# Patient Record
Sex: Female | Born: 1980 | Race: Black or African American | Hispanic: No | Marital: Single | Smoking: Never smoker
Health system: Southern US, Community
[De-identification: ages and names within clinical notes are randomized; demographics above are authoritative.]

## PROBLEM LIST (undated history)

## (undated) DIAGNOSIS — K589 Irritable bowel syndrome without diarrhea: Secondary | ICD-10-CM

## (undated) HISTORY — DX: Irritable bowel syndrome without diarrhea: K58.9

---

## 1998-08-18 ENCOUNTER — Other Ambulatory Visit: Admission: RE | Admit: 1998-08-18 | Discharge: 1998-08-18 | Payer: Self-pay | Admitting: Obstetrics & Gynecology

## 1999-03-30 ENCOUNTER — Inpatient Hospital Stay (HOSPITAL_COMMUNITY): Admission: AD | Admit: 1999-03-30 | Discharge: 1999-04-02 | Payer: Self-pay | Admitting: Obstetrics & Gynecology

## 1999-04-29 ENCOUNTER — Other Ambulatory Visit: Admission: RE | Admit: 1999-04-29 | Discharge: 1999-04-29 | Payer: Self-pay | Admitting: Obstetrics and Gynecology

## 1999-06-17 ENCOUNTER — Other Ambulatory Visit: Admission: RE | Admit: 1999-06-17 | Discharge: 1999-06-17 | Payer: Self-pay | Admitting: Obstetrics and Gynecology

## 1999-06-17 ENCOUNTER — Encounter (INDEPENDENT_AMBULATORY_CARE_PROVIDER_SITE_OTHER): Payer: Self-pay | Admitting: Specialist

## 1999-11-24 ENCOUNTER — Other Ambulatory Visit: Admission: RE | Admit: 1999-11-24 | Discharge: 1999-11-24 | Payer: Self-pay | Admitting: Obstetrics and Gynecology

## 2000-12-30 ENCOUNTER — Emergency Department (HOSPITAL_COMMUNITY): Admission: EM | Admit: 2000-12-30 | Discharge: 2000-12-30 | Payer: Self-pay | Admitting: *Deleted

## 2001-03-16 ENCOUNTER — Encounter: Payer: Self-pay | Admitting: *Deleted

## 2001-03-16 ENCOUNTER — Inpatient Hospital Stay (HOSPITAL_COMMUNITY): Admission: AD | Admit: 2001-03-16 | Discharge: 2001-03-16 | Payer: Self-pay | Admitting: *Deleted

## 2001-05-16 ENCOUNTER — Other Ambulatory Visit: Admission: RE | Admit: 2001-05-16 | Discharge: 2001-05-16 | Payer: Self-pay | Admitting: Family Medicine

## 2001-07-23 ENCOUNTER — Other Ambulatory Visit: Admission: RE | Admit: 2001-07-23 | Discharge: 2001-07-23 | Payer: Self-pay | Admitting: Obstetrics & Gynecology

## 2001-07-23 ENCOUNTER — Encounter: Admission: RE | Admit: 2001-07-23 | Discharge: 2001-07-23 | Payer: Self-pay | Admitting: Obstetrics & Gynecology

## 2002-03-06 ENCOUNTER — Other Ambulatory Visit: Admission: RE | Admit: 2002-03-06 | Discharge: 2002-03-06 | Payer: Self-pay | Admitting: Family Medicine

## 2004-02-04 ENCOUNTER — Other Ambulatory Visit: Admission: RE | Admit: 2004-02-04 | Discharge: 2004-02-04 | Payer: Self-pay | Admitting: Family Medicine

## 2004-08-11 ENCOUNTER — Other Ambulatory Visit: Admission: RE | Admit: 2004-08-11 | Discharge: 2004-08-11 | Payer: Self-pay | Admitting: Family Medicine

## 2006-07-19 ENCOUNTER — Other Ambulatory Visit: Admission: RE | Admit: 2006-07-19 | Discharge: 2006-07-19 | Payer: Self-pay | Admitting: Family Medicine

## 2007-03-24 ENCOUNTER — Emergency Department (HOSPITAL_COMMUNITY): Admission: EM | Admit: 2007-03-24 | Discharge: 2007-03-24 | Payer: Self-pay | Admitting: *Deleted

## 2007-04-19 ENCOUNTER — Other Ambulatory Visit: Admission: RE | Admit: 2007-04-19 | Discharge: 2007-04-19 | Payer: Self-pay | Admitting: Family Medicine

## 2007-12-05 ENCOUNTER — Other Ambulatory Visit: Admission: RE | Admit: 2007-12-05 | Discharge: 2007-12-05 | Payer: Self-pay | Admitting: Family Medicine

## 2008-06-19 ENCOUNTER — Other Ambulatory Visit: Admission: RE | Admit: 2008-06-19 | Discharge: 2008-06-19 | Payer: Self-pay | Admitting: Family Medicine

## 2009-10-02 ENCOUNTER — Emergency Department (HOSPITAL_COMMUNITY): Admission: EM | Admit: 2009-10-02 | Discharge: 2009-10-02 | Payer: Self-pay | Admitting: Emergency Medicine

## 2009-10-27 ENCOUNTER — Emergency Department (HOSPITAL_COMMUNITY): Admission: EM | Admit: 2009-10-27 | Discharge: 2009-10-27 | Payer: Self-pay | Admitting: Emergency Medicine

## 2010-01-24 ENCOUNTER — Emergency Department (HOSPITAL_COMMUNITY): Admission: EM | Admit: 2010-01-24 | Discharge: 2010-01-24 | Payer: Self-pay | Admitting: Family Medicine

## 2010-10-19 ENCOUNTER — Inpatient Hospital Stay (INDEPENDENT_AMBULATORY_CARE_PROVIDER_SITE_OTHER)
Admission: RE | Admit: 2010-10-19 | Discharge: 2010-10-19 | Disposition: A | Discharge status: Home / Self Care | Payer: 59 | Source: Ambulatory Visit | Attending: Emergency Medicine | Admitting: Emergency Medicine

## 2010-10-19 ENCOUNTER — Ambulatory Visit (INDEPENDENT_AMBULATORY_CARE_PROVIDER_SITE_OTHER): Payer: 59

## 2010-10-19 DIAGNOSIS — K5900 Constipation, unspecified: Secondary | ICD-10-CM

## 2010-10-19 LAB — POCT URINALYSIS DIPSTICK
Bilirubin Urine: NEGATIVE
Hgb urine dipstick: NEGATIVE
Ketones, ur: NEGATIVE mg/dL
Nitrite: NEGATIVE
Protein, ur: NEGATIVE mg/dL
Specific Gravity, Urine: 1.02 (ref 1.005–1.030)
Urine Glucose, Fasting: NEGATIVE mg/dL
Urobilinogen, UA: 1 mg/dL (ref 0.0–1.0)
pH: 6.5 (ref 5.0–8.0)

## 2010-10-19 LAB — POCT PREGNANCY, URINE: Preg Test, Ur: NEGATIVE

## 2010-11-16 ENCOUNTER — Inpatient Hospital Stay (INDEPENDENT_AMBULATORY_CARE_PROVIDER_SITE_OTHER)
Admission: RE | Admit: 2010-11-16 | Discharge: 2010-11-16 | Disposition: A | Discharge status: Home / Self Care | Payer: 59 | Source: Ambulatory Visit | Attending: Family Medicine | Admitting: Family Medicine

## 2010-11-16 DIAGNOSIS — R05 Cough: Secondary | ICD-10-CM

## 2010-11-16 DIAGNOSIS — J069 Acute upper respiratory infection, unspecified: Secondary | ICD-10-CM

## 2010-11-16 DIAGNOSIS — H6120 Impacted cerumen, unspecified ear: Secondary | ICD-10-CM

## 2010-11-28 LAB — POCT URINALYSIS DIP (DEVICE)
Bilirubin Urine: NEGATIVE
Glucose, UA: NEGATIVE mg/dL
Ketones, ur: NEGATIVE mg/dL
Nitrite: NEGATIVE
Protein, ur: 30 mg/dL — AB
Specific Gravity, Urine: 1.02 (ref 1.005–1.030)
Urobilinogen, UA: 2 mg/dL — ABNORMAL HIGH (ref 0.0–1.0)
pH: 7 (ref 5.0–8.0)

## 2010-11-28 LAB — POCT PREGNANCY, URINE: Preg Test, Ur: NEGATIVE

## 2010-12-01 LAB — WET PREP, GENITAL

## 2010-12-01 LAB — GC/CHLAMYDIA PROBE AMP, GENITAL: GC Probe Amp, Genital: NEGATIVE

## 2010-12-01 LAB — POCT PREGNANCY, URINE: Preg Test, Ur: NEGATIVE

## 2011-08-04 ENCOUNTER — Emergency Department (HOSPITAL_COMMUNITY)
Admission: EM | Admit: 2011-08-04 | Discharge: 2011-08-04 | Disposition: A | Discharge status: Home / Self Care | Payer: 59 | Source: Home / Self Care | Attending: Family Medicine | Admitting: Family Medicine

## 2011-08-04 ENCOUNTER — Encounter: Payer: Self-pay | Admitting: Cardiology

## 2011-08-04 DIAGNOSIS — S83419A Sprain of medial collateral ligament of unspecified knee, initial encounter: Secondary | ICD-10-CM

## 2011-08-04 MED ORDER — NAPROXEN 375 MG PO TABS: 375.0000 mg | ORAL_TABLET | Freq: Two times a day (BID) | ORAL | Status: AC

## 2011-08-04 MED ORDER — HYDROCODONE-ACETAMINOPHEN 5-325 MG PO TABS: ORAL_TABLET | ORAL | Status: AC

## 2011-08-04 NOTE — ED Provider Notes (Signed)
History     CSN: [id number] Arrival date & time: 08/04/2011 11:01 AM   First MD Initiated Contact with Patient 08/04/11 1018      Chief Complaint  Patient presents with  . Knee Pain    (Consider location/radiation/quality/duration/timing/severity/associated sxs/prior treatment) HPI Comments: Lynn Mitchell presents for evaluation of pain in her RIGHT knee over the last week. She denies any specific injury but reports that she had been working out on treadmills and elliptical machines lately. She denies any numbness, tingling, or weakness. She reports pain on the medial side of her RIGHT knee, mostly with movement.   Patient is a 30 y.o. female presenting with knee pain. The history is provided by the patient.  Knee Pain This is a new problem. The current episode started more than 2 days ago. The problem occurs constantly. The problem has not changed since onset.The symptoms are aggravated by walking, exertion and standing.    History reviewed. No pertinent past medical history.  Past Surgical History  Procedure Date  . Cesarean section 2000    No family history on file.  History  Substance Use Topics  . Smoking status: Never Smoker   . Smokeless tobacco: Not on file  . Alcohol Use: Yes     social    OB History    Grav Para Term Preterm Abortions TAB SAB Ect Mult Living                  Review of Systems  Constitutional: Negative.   HENT: Negative.   Eyes: Negative.   Respiratory: Negative.   Cardiovascular: Negative.   Gastrointestinal: Negative.   Genitourinary: Negative.   Musculoskeletal: Positive for myalgias and arthralgias.  Skin: Negative.   Neurological: Negative.     Allergies  Review of patient's allergies indicates no known allergies.  Home Medications   Current Outpatient Rx  Name Route Sig Dispense Refill  . CLINDAMYCIN PHOS-BENZOYL PEROX 1.2-5 % EX GEL Topical Apply topically daily.      Marland Kitchen DOXY-CAPS PO Oral Take 2 capsules by mouth 2 (two) times  daily.      Marland Kitchen PHENTERMINE HCL 15 MG PO CAPS Oral Take 15 mg by mouth every morning.      Marland Kitchen HYDROCODONE-ACETAMINOPHEN 5-325 MG PO TABS  Take one to two tablets every 4 to 6 hours as needed for pain 20 tablet 0  . NAPROXEN 375 MG PO TABS Oral Take 1 tablet (375 mg total) by mouth 2 (two) times daily. 20 tablet 0    BP 109/73  Pulse 68  Temp(Src) 97.6 F (36.4 C) (Oral)  Resp 18  SpO2 100%  LMP 07/30/2011  Physical Exam  Constitutional: She is oriented to person, place, and time. She appears well-developed and well-nourished.  HENT:  Head: Normocephalic and atraumatic.  Eyes: EOM are normal.  Neck: Normal range of motion.  Musculoskeletal:       Legs: Neurological: She is alert and oriented to person, place, and time.  Skin: Skin is warm and dry.    ED Course  Procedures (including critical care time)  Labs Reviewed - No data to display No results found.   1. Knee MCL sprain       MDM          Richardo Priest, MD 08/04/11 1149

## 2011-08-04 NOTE — ED Notes (Signed)
Pt reports knee pain that has been ongoing for the past week.  Pain started in the arch of her foot and that has calmed down. Now the pain is in the medial side of the right knee. Pain worse at night when not using it for a period of time. Pt describes pain a pressure and stiffness.

## 2012-10-26 IMAGING — CR DG ABDOMEN 1V
1 series · 1 of 1 positions shown · non-contrast
Comparison: None.

CLINICAL DATA: Abdominal pain.

ABDOMEN - 1 VIEW

[view not recorded]
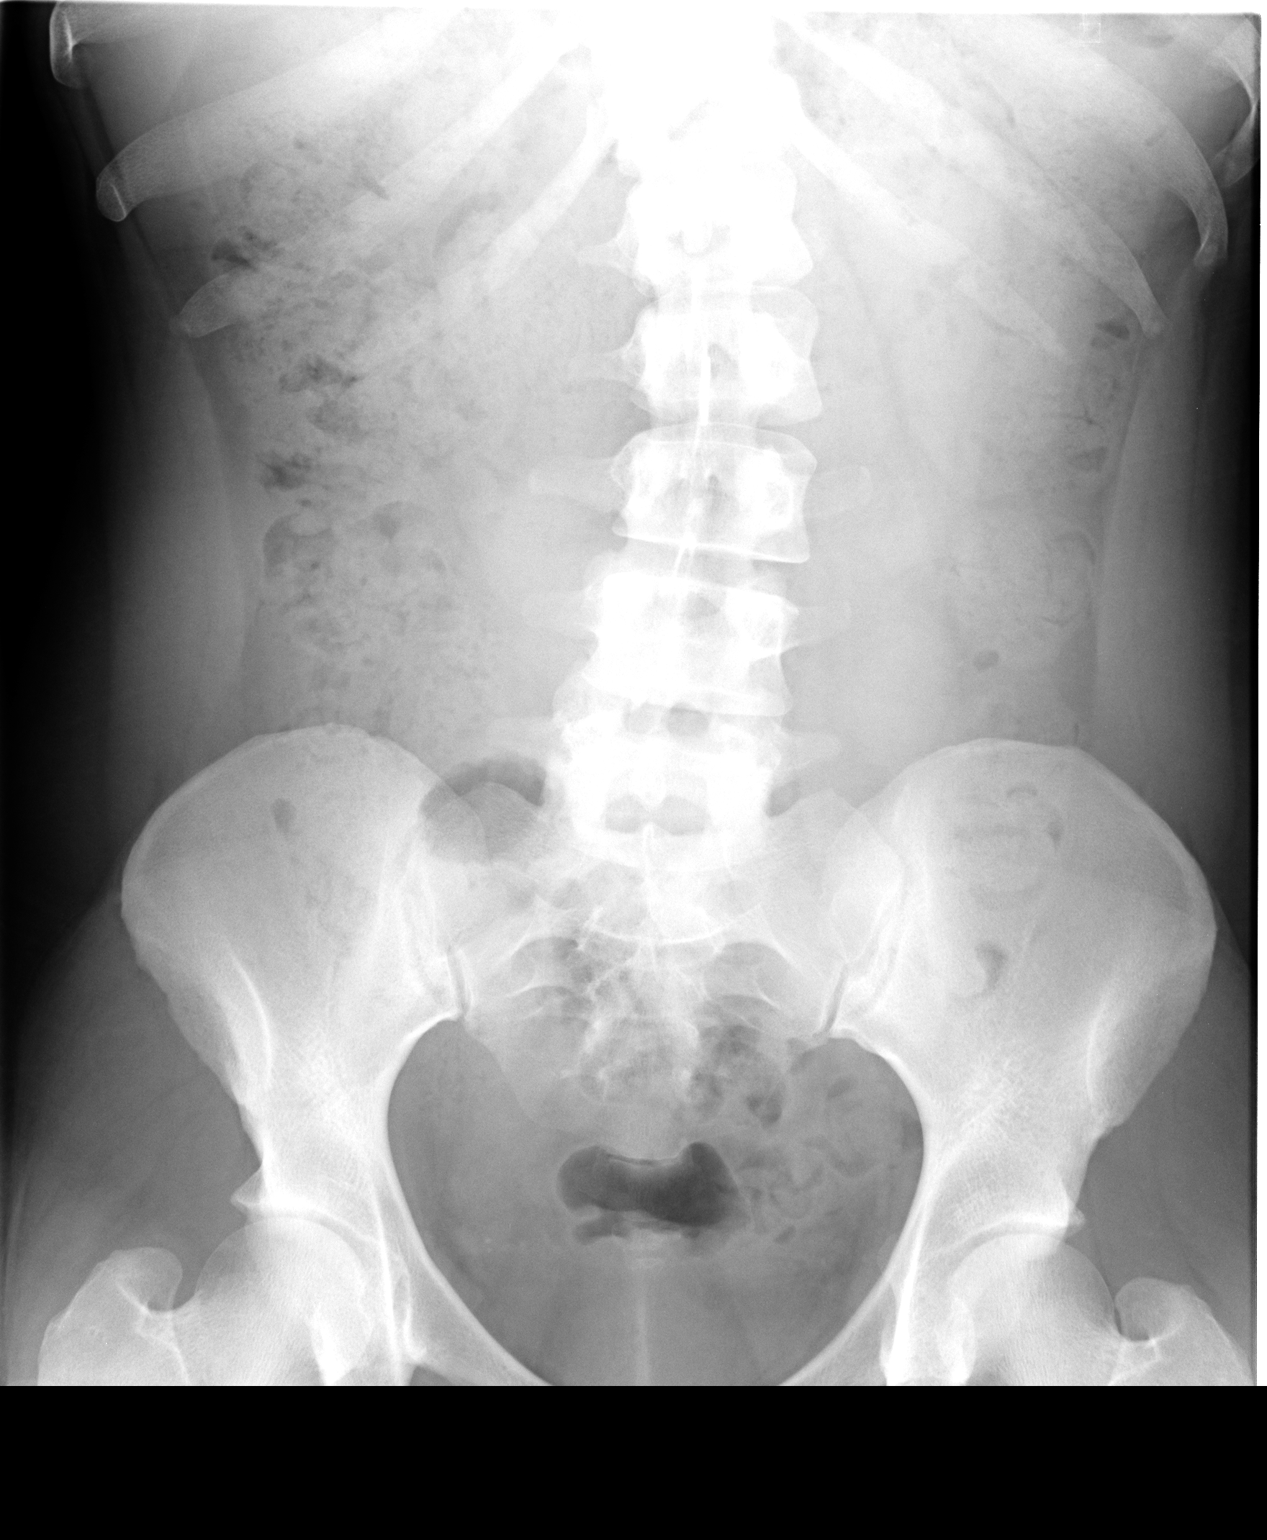

[1 of 1 positions shown; findings below may reference images not displayed]

FINDINGS: Stool is seen throughout the colon.  No small bowel
dilatation.  Mild levoconvex curvature of the lumbar spine.
IMPRESSION: Constipation.

## 2013-01-31 ENCOUNTER — Ambulatory Visit (INDEPENDENT_AMBULATORY_CARE_PROVIDER_SITE_OTHER): Payer: 59 | Admitting: Sports Medicine

## 2013-01-31 ENCOUNTER — Encounter: Payer: Self-pay | Admitting: Sports Medicine

## 2013-01-31 VITALS — BP 108/60 | HR 69 | Wt 175.0 lb

## 2013-01-31 DIAGNOSIS — E669 Obesity, unspecified: Secondary | ICD-10-CM

## 2013-01-31 DIAGNOSIS — K589 Irritable bowel syndrome without diarrhea: Secondary | ICD-10-CM

## 2013-01-31 DIAGNOSIS — Z299 Encounter for prophylactic measures, unspecified: Secondary | ICD-10-CM | POA: Insufficient documentation

## 2013-01-31 DIAGNOSIS — K581 Irritable bowel syndrome with constipation: Secondary | ICD-10-CM | POA: Insufficient documentation

## 2013-01-31 MED ORDER — LUBIPROSTONE 24 MCG PO CAPS: 24.0000 ug | ORAL_CAPSULE | Freq: Two times a day (BID) | ORAL | Status: DC

## 2013-01-31 MED ORDER — PHENTERMINE HCL 37.5 MG PO CAPS: 37.5000 mg | ORAL_CAPSULE | ORAL | Status: DC

## 2013-01-31 NOTE — Progress Notes (Signed)
  Subjective:    CC: Establish care.   HPI:  Obesity: Desires help losing weight, has tried dieting, exercise. Is amenable to pharmacologic measures.  Irritable bowel syndrome, constipation predominant: Symptoms usually present with vague abdominal pain, that is fairly diffuse, mild, better with stooling, associated with constipation, no radiation, overall doing pretty well. She is taking Amitiza 8 milligrams, wonders if there is a higher dose that she can use.  Preventive measure: Most recent Pap smear was in July 2013. She does have an OB/GYN that does these.  Past medical history, Surgical history, Family history not pertinant except as noted below, Social history, Allergies, and medications have been entered into the medical record, reviewed, and no changes needed.   Review of Systems: No headache, visual changes, nausea, vomiting, diarrhea, constipation, dizziness, abdominal pain, skin rash, fevers, chills, night sweats, swollen lymph nodes, weight loss, chest pain, body aches, joint swelling, muscle aches, shortness of breath, mood changes, visual or auditory hallucinations.  Objective:    General: Well Developed, well nourished, and in no acute distress.  Neuro: Alert and oriented x3, extra-ocular muscles intact, sensation grossly intact.  HEENT: Normocephalic, atraumatic, pupils equal round reactive to light, neck supple, no masses, no lymphadenopathy, thyroid nonpalpable.  Skin: Warm and dry, no rashes noted.  Cardiac: Regular rate and rhythm, no murmurs rubs or gallops.  Respiratory: Clear to auscultation bilaterally. Not using accessory muscles, speaking in full sentences.  Abdominal: Soft, nontender, nondistended, positive bowel sounds, no masses, no organomegaly.  Musculoskeletal: Shoulder, elbow, wrist, hip, knee, ankle stable, and with full range of motion. Impression and Recommendations:    The patient was counselled, risk factors were discussed, anticipatory guidance  given.

## 2013-01-31 NOTE — Assessment & Plan Note (Signed)
Nutrition referral, phentermine.

## 2013-01-31 NOTE — Assessment & Plan Note (Signed)
Screening blood work. Paps are done with her obstetrician.

## 2013-01-31 NOTE — Assessment & Plan Note (Signed)
Increasing Amitiza to 24 mg.

## 2013-02-05 LAB — URINALYSIS
Bilirubin Urine: NEGATIVE
Glucose, UA: NEGATIVE mg/dL
Hgb urine dipstick: NEGATIVE
Ketones, ur: NEGATIVE mg/dL
Leukocytes, UA: NEGATIVE
Nitrite: NEGATIVE
Protein, ur: NEGATIVE mg/dL
Specific Gravity, Urine: 1.026 (ref 1.005–1.030)
Urobilinogen, UA: 1 mg/dL (ref 0.0–1.0)
pH: 5.5 (ref 5.0–8.0)

## 2013-02-05 LAB — CBC
HCT: 33.6 % — ABNORMAL LOW (ref 36.0–46.0)
Hemoglobin: 12.4 g/dL (ref 12.0–15.0)
MCH: 31.1 pg (ref 26.0–34.0)
MCHC: 36.9 g/dL — ABNORMAL HIGH (ref 30.0–36.0)
MCV: 84.2 fL (ref 78.0–100.0)
Platelets: 299 10*3/uL (ref 150–400)
RBC: 3.99 MIL/uL (ref 3.87–5.11)
RDW: 14.4 % (ref 11.5–15.5)
WBC: 9 10*3/uL (ref 4.0–10.5)

## 2013-02-05 LAB — COMPREHENSIVE METABOLIC PANEL
ALT: 17 U/L (ref 0–35)
AST: 16 U/L (ref 0–37)
Calcium: 9 mg/dL (ref 8.4–10.5)
Chloride: 107 mEq/L (ref 96–112)
Creat: 0.76 mg/dL (ref 0.50–1.10)
Total Bilirubin: 0.6 mg/dL (ref 0.3–1.2)

## 2013-02-05 LAB — COMPREHENSIVE METABOLIC PANEL WITH GFR
Albumin: 3.9 g/dL (ref 3.5–5.2)
Alkaline Phosphatase: 72 U/L (ref 39–117)
BUN: 9 mg/dL (ref 6–23)
CO2: 26 meq/L (ref 19–32)
Glucose, Bld: 81 mg/dL (ref 70–99)
Potassium: 3.9 meq/L (ref 3.5–5.3)
Sodium: 140 meq/L (ref 135–145)
Total Protein: 6.6 g/dL (ref 6.0–8.3)

## 2013-02-05 LAB — HEMOGLOBIN A1C
Hgb A1c MFr Bld: 4.7 % (ref <5.7)
Mean Plasma Glucose: 88 mg/dL (ref <117)

## 2013-02-05 LAB — LIPID PANEL
Cholesterol: 136 mg/dL (ref 0–200)
HDL: 37 mg/dL — ABNORMAL LOW (ref >39)
LDL Cholesterol: 78 mg/dL (ref 0–99)
Total CHOL/HDL Ratio: 3.7 Ratio
Triglycerides: 103 mg/dL (ref <150)
VLDL: 21 mg/dL (ref 0–40)

## 2013-02-05 LAB — TSH: TSH: 1.637 u[IU]/mL (ref 0.350–4.500)

## 2013-02-06 LAB — VITAMIN D 25 HYDROXY (VIT D DEFICIENCY, FRACTURES): Vit D, 25-Hydroxy: 31 ng/mL (ref 30–89)

## 2013-03-11 ENCOUNTER — Ambulatory Visit: Payer: 59 | Admitting: Dietician

## 2013-03-27 ENCOUNTER — Encounter: Payer: 59 | Attending: Sports Medicine | Admitting: *Deleted

## 2013-03-27 ENCOUNTER — Encounter: Payer: Self-pay | Admitting: *Deleted

## 2013-03-27 VITALS — Ht 62.5 in | Wt 170.2 lb

## 2013-03-27 DIAGNOSIS — Z713 Dietary counseling and surveillance: Secondary | ICD-10-CM | POA: Insufficient documentation

## 2013-03-27 DIAGNOSIS — E669 Obesity, unspecified: Secondary | ICD-10-CM | POA: Insufficient documentation

## 2013-03-27 NOTE — Progress Notes (Signed)
Medical Nutrition Therapy:  Appt start time: 1145 end time:  1245.  Assessment:  Primary concern today: Obesity. Patient reports that she recently started phentermine in order to assist with weight loss. She states that her appetite decreased initially, but now she doesn't notice a change. She has been tracking her diet using a phone app, but states that she is frustrated that she is not making the progress she would like. She has lost 5 pounds in the last 8 weeks and has noticed that clothes fit better. She reports that she is having difficulty staying motivated and is getting bored with food choices. Stress eating in the afternoon is an issue (doughnut, cupcakes, sunflower seeds). However, this doesn't happen regularly. Overall, her diet quality is good. Realistic expectations need to be set.   MEDICATIONS: Phentermine   DIETARY INTAKE:   Usual eating pattern includes 3 meals and 3 snacks per day.  24-hr recall:  B ( AM): Whole egg, egg white, English muffin, 2 slices [place] bacon OR apples and cinnamon oatmeal  Snk ( AM): Fruit (watermelon, cantaloupe, banana), yogurt, pretzels, animal crackers, 3 times in the morning L (2 PM): Shrimp/chicken, brown rice, stir fry Snk ( PM): None D ( PM): Whole wheat spaghetti with ground beef OR lemon pepper chicken, broccoli with cheese Snk ( PM): None Beverages: Water, flavored water  Usual physical activity: 2 days a week spin class (45 minutes), 2 days a week treadmill/elliptical 3 miles (~50 minutes) and strength training 15 minutes  Estimated energy needs: 1400 calories 175 g carbohydrates 88 g protein 38 g fat  Progress Towards Goal(s):  In progress.   Nutritional Diagnosis:  [place]-3.3 Overweight/obesity As related to history of excessive energy intake.  As evidenced by BMI 30.7.    Intervention:  Nutrition counseling. Patient educated on strategies for weight loss, including balancing nutrients (carbs, protein, fat), portion control, healthy  snacks, and exercise. We discussed realistic weight loss goals and changing mind set about eating (good foods vs. Bad foods, guilt, etc.). We also discussed using waist circumference as another met  Goals:  1. 1 pounds weight loss per week. Goal weight: 150 pounds 2. Exercise 45-60 minutes 4-5 days weekly (spin class, treadmill, elliptical, weights). 3. Practice 3 weeks on, 1 week off (maintenance) if needed every month.  4. Set limits for intake of less healthy foods (french fries, sweets) and keep portion size moderate.  5. Balance macronutrients at meals.  6. Celebrate small successes in weight loss/waist circumference.  Handouts given during visit include:  5 day sample meal plan  Weight loss tips  Yellow meal plan card  Monitoring/Evaluation:  Dietary intake, exercise, and body weight in 2 month(s).

## 2013-05-29 ENCOUNTER — Ambulatory Visit: Payer: 59 | Admitting: *Deleted

## 2014-08-09 ENCOUNTER — Other Ambulatory Visit (HOSPITAL_COMMUNITY)
Admission: RE | Admit: 2014-08-09 | Discharge: 2014-08-09 | Disposition: A | Discharge status: Home / Self Care | Payer: BC Managed Care – PPO | Source: Ambulatory Visit | Attending: Family Medicine | Admitting: Family Medicine

## 2014-08-09 ENCOUNTER — Emergency Department (HOSPITAL_COMMUNITY)
Admission: EM | Admit: 2014-08-09 | Discharge: 2014-08-09 | Disposition: A | Discharge status: Home / Self Care | Payer: BC Managed Care – PPO | Source: Home / Self Care | Attending: Family Medicine | Admitting: Family Medicine

## 2014-08-09 ENCOUNTER — Encounter (HOSPITAL_COMMUNITY): Payer: Self-pay | Admitting: *Deleted

## 2014-08-09 DIAGNOSIS — N760 Acute vaginitis: Secondary | ICD-10-CM

## 2014-08-09 DIAGNOSIS — Z113 Encounter for screening for infections with a predominantly sexual mode of transmission: Secondary | ICD-10-CM | POA: Insufficient documentation

## 2014-08-09 DIAGNOSIS — K5901 Slow transit constipation: Secondary | ICD-10-CM

## 2014-08-09 LAB — POCT URINALYSIS DIP (DEVICE)
BILIRUBIN URINE: NEGATIVE
GLUCOSE, UA: NEGATIVE mg/dL
Hgb urine dipstick: NEGATIVE
KETONES UR: NEGATIVE mg/dL
LEUKOCYTES UA: NEGATIVE
NITRITE: NEGATIVE
PH: 7 (ref 5.0–8.0)
Protein, ur: NEGATIVE mg/dL
Specific Gravity, Urine: 1.015 (ref 1.005–1.030)
Urobilinogen, UA: 0.2 mg/dL (ref 0.0–1.0)

## 2014-08-09 LAB — POCT PREGNANCY, URINE: PREG TEST UR: NEGATIVE

## 2014-08-09 NOTE — ED Provider Notes (Signed)
CSN: [id number]     Arrival date & time 08/09/14  0913 History   First MD Initiated Contact with Patient 08/09/14 [phone]     Chief Complaint  Patient presents with  . Vaginal Discharge   (Consider location/radiation/quality/duration/timing/severity/associated sxs/prior Treatment) Patient is a 33 y.o. female presenting with vaginal discharge. The history is provided by the patient.  Vaginal Discharge Quality:  White Severity:  Mild Onset quality:  Gradual Duration:  1 week Progression:  Unchanged Chronicity:  Recurrent Associated symptoms: abdominal pain   Associated symptoms: no nausea and no vomiting   Associated symptoms comment:  Constipation problem for long time.   Past Medical History  Diagnosis Date  . IBS (irritable bowel syndrome)    Past Surgical History  Procedure Laterality Date  . Cesarean section  2000   Family History  Problem Relation Age of Onset  . Diabetes Mother   . Diabetes Maternal Grandmother   . Hypertension Maternal Grandmother    History  Substance Use Topics  . Smoking status: Never Smoker   . Smokeless tobacco: Not on file  . Alcohol Use: Yes     Comment: social   OB History    No data available     Review of Systems  Constitutional: Negative.   Gastrointestinal: Positive for abdominal pain and constipation. Negative for nausea, vomiting and diarrhea.  Genitourinary: Positive for vaginal discharge. Negative for menstrual problem and pelvic pain.    Allergies  Penicillins  Home Medications   Prior to Admission medications   Medication Sig Start Date End Date Taking? Authorizing Provider  Clindamycin-Benzoyl Per, Refr, gel Apply topically daily.      Historical Provider, MD  Doxycycline Hyclate (DOXY-CAPS PO) Take 2 capsules by mouth 2 (two) times daily.      Historical Provider, MD  lubiprostone (AMITIZA) 24 MCG capsule Take 1 capsule (24 mcg total) by mouth 2 (two) times daily with a meal. 01/31/13   Monica Bectonhomas J Thekkekandam, MD   phentermine 37.5 MG capsule Take 1 capsule (37.5 mg total) by mouth every morning. 01/31/13   Monica Bectonhomas J Thekkekandam, MD   BP 112/73 mmHg  Pulse 72  Temp(Src) 98 F (36.7 C) (Oral)  Resp 18  SpO2 99%  LMP 07/27/2014 Physical Exam  Constitutional: She is oriented to person, place, and time. She appears well-developed and well-nourished.  Abdominal: Soft. Bowel sounds are normal. She exhibits no distension and no mass. There is tenderness in the left lower quadrant. There is no rigidity, no rebound, no guarding and no CVA tenderness.  Genitourinary: Vagina normal and uterus normal. No vaginal discharge found.  Neurological: She is alert and oriented to person, place, and time.  Skin: Skin is warm and dry.  Nursing note and vitals reviewed.   ED Course  Procedures (including critical care time) Labs Review Labs Reviewed  POCT URINALYSIS DIP (DEVICE)  POCT PREGNANCY, URINE  CERVICOVAGINAL ANCILLARY ONLY    Imaging Review No results found.   MDM   1. Vaginitis   2. Slow transit constipation        Linna HoffJames D Nils Thor, MD 08/09/14 [phone]

## 2014-08-09 NOTE — ED Notes (Signed)
Dr    Artis FlockKindl       And     Desiree    Performed  A  Pelvic  Exam     -       approx  5  mins  After  The  Exam      This   Writer    Walked  Open  Door  And  Noticed      The  Room  Vacant  And  Gown on the  Table  Waiting  Room  Was  Checked         Pt  Apparently    Eloped

## 2014-08-09 NOTE — ED Notes (Addendum)
Pt  Reports   Symptoms  Of  Vaginal  Discharge  With  Low  abd  Pain   For   sev  Days       Ambulated  To  Room with a  Steady  Fluid    Gait   Sitting  Upright on  Exam table  Speaking in  Complete  sentances  Pt     Also  Reports    Symptoms     Of  Constipation

## 2014-08-10 LAB — CERVICOVAGINAL ANCILLARY ONLY
CHLAMYDIA, DNA PROBE: NEGATIVE
Neisseria Gonorrhea: NEGATIVE
WET PREP (BD AFFIRM): NEGATIVE
Wet Prep (BD Affirm): NEGATIVE
Wet Prep (BD Affirm): POSITIVE — AB

## 2014-08-12 NOTE — ED Notes (Signed)
GC/Chlamydia neg., Affirm: Candida and Trich neg., Gardnerella pos.  Message sent to Dr. Artis FlockKindl. Vassie MoselleYork, Seng Fouts M 08/12/2014

## 2014-08-14 ENCOUNTER — Telehealth (HOSPITAL_COMMUNITY): Payer: Self-pay | Admitting: Emergency Medicine

## 2014-08-14 MED ORDER — METRONIDAZOLE 500 MG PO TABS: 500.0000 mg | ORAL_TABLET | Freq: Two times a day (BID) | ORAL | Status: DC

## 2014-08-14 NOTE — ED Notes (Addendum)
Order for Flagyl from Dr. Piedad ClimesHonig.  I called pt. and left a message to call.  Call 1. 08/14/2014 Left message.  Call 2. 08/16/2014 I called pt. Pt. verified x 2 and given results.  Pt. told she needs Flagyl for bacterial vaginosis and where to pick up her Rx. [place]. I told her she can go to the [facility] there and get it transferred.    Pt. instructed to no alcohol while taking this medication. Lynn Mitchell, Lynn Mitchell 08/18/2014

## 2014-08-17 NOTE — ED Notes (Signed)
Patient called about lab reports, and after verifying ID discussed positive findings , and rx called in to pahrmacy. Advised patient to inform partner , so they may also be treated and she will not be reinfected

## 2019-11-07 ENCOUNTER — Ambulatory Visit
Admission: EM | Admit: 2019-11-07 | Discharge: 2019-11-07 | Disposition: A | Discharge status: Home / Self Care | Payer: Managed Care, Other (non HMO) | Attending: Family Medicine | Admitting: Family Medicine

## 2019-11-07 DIAGNOSIS — J029 Acute pharyngitis, unspecified: Secondary | ICD-10-CM

## 2019-11-07 DIAGNOSIS — J040 Acute laryngitis: Secondary | ICD-10-CM

## 2019-11-07 MED ORDER — CETIRIZINE HCL 10 MG PO TABS: 10.0000 mg | ORAL_TABLET | Freq: Every day | ORAL | 0 refills | Status: DC

## 2019-11-07 MED ORDER — PREDNISONE 20 MG PO TABS: 20.0000 mg | ORAL_TABLET | Freq: Every day | ORAL | 0 refills | Status: AC

## 2019-11-07 NOTE — ED Triage Notes (Signed)
Pt presents with c/o sore throat, mainly on the right side, for 3-5 days. She does feel her tonsils may be swollen and a dry cough mainly at night. She denies any known sick contacts. She has taken some OTC cold/sinus medication with no improvement. She denies nasal/sinus congestion, ear pain, fever/chills, shob or other symptoms.

## 2019-11-07 NOTE — ED Provider Notes (Signed)
[facility]    CSN: [id number] Arrival date & time: 11/07/19  1112      History   Chief Complaint Chief Complaint  Patient presents with  . Sore Throat    HPI Lynn Mitchell is a 39 y.o. female.   Lynn Mitchell presents with complaints of sore throat. 3-5 days of sore throat. Feels "enlarged" and uncomforatble. Some voice hoarseness. Notes that at night she feels like her throat gets dry which makes her cough. Otherwise no cough, especially during the day. No nasal drainage. No ear pain, no headache. No fevers, no body aches. Has been taking over the counter cold-flu tablets as well as night time cold/ flu, which does help some. No known ill contacts. Working from. History of throat issues in the past, but not in years. No skin rash. No gi symptoms. No sinus or throat surgeries. As a child was recommend to have tonsils out but did not.   ROS per HPI, negative if not otherwise mentioned.      Past Medical History:  Diagnosis Date  . IBS (irritable bowel syndrome)     Patient Active Problem List   Diagnosis Date Noted  . Obesity 01/31/2013  . Irritable bowel syndrome with constipation 01/31/2013  . Preventive measure 01/31/2013    Past Surgical History:  Procedure Laterality Date  . CESAREAN SECTION  2000    OB History   No obstetric history on file.      Home Medications    Prior to Admission medications   Medication Sig Start Date End Date Taking? Authorizing Provider  cetirizine (ZYRTEC) 10 MG tablet Take 1 tablet (10 mg total) by mouth daily. 11/07/19   Zigmund Gottron, NP  citalopram (CELEXA) 10 MG tablet Take 10 mg by mouth daily. 10/21/19   [provider]  predniSONE (DELTASONE) 20 MG tablet Take 1 tablet (20 mg total) by mouth daily with breakfast for 3 days. 11/07/19 11/10/19  Zigmund Gottron, NP    Family History Family History  Problem Relation Age of Onset  . Diabetes Mother   . Diabetes Maternal Grandmother   .  Hypertension Maternal Grandmother     Social History Social History   Tobacco Use  . Smoking status: Never Smoker  . Smokeless tobacco: Never Used  Substance Use Topics  . Alcohol use: Yes    Comment: social  . Drug use: No     Allergies   Penicillins   Review of Systems Review of Systems   Physical Exam Triage Vital Signs ED Triage Vitals  Enc Vitals Group     BP 11/07/19 1142 129/81     Pulse Rate 11/07/19 1142 70     Resp 11/07/19 1142 19     Temp 11/07/19 1142 98.1 F (36.7 C)     Temp Source 11/07/19 1142 Oral     SpO2 11/07/19 1142 99 %     Weight 11/07/19 1138 180 lb (81.6 kg)     Height 11/07/19 1138 5\' 2"  (1.575 m)     Head Circumference --      Peak Flow --      Pain Score 11/07/19 1137 6     Pain Loc --      Pain Edu? --      Excl. in [place]? --    No data found.  Updated Vital Signs BP 129/81 (BP Location: Left Arm)   Pulse 70   Temp 98.1 F (36.7 C) (Oral)   Resp  19   Ht 5\' 2"  (1.575 m)   Wt 180 lb (81.6 kg)   LMP 10/27/2019   SpO2 99%   BMI 32.92 kg/m    Physical Exam Constitutional:      General: She is not in acute distress.    Appearance: She is well-developed.  HENT:     Right Ear: Tympanic membrane and ear canal normal.     Left Ear: Tympanic membrane and ear canal normal.     Mouth/Throat:     Pharynx: Posterior oropharyngeal erythema present.     Tonsils: No tonsillar exudate or tonsillar abscesses. 1+ on the right. 1+ on the left.     Comments: Noted mild hoarseness to voice; small post nasal drip and cobblestoning noted  Cardiovascular:     Rate and Rhythm: Normal rate.  Pulmonary:     Effort: Pulmonary effort is normal.  Lymphadenopathy:     Cervical: No cervical adenopathy.  Skin:    General: Skin is warm and dry.  Neurological:     Mental Status: She is alert and oriented to person, place, and time.      UC Treatments / Results  Labs (all labs ordered are listed, but only abnormal results are displayed) Labs  Reviewed - No data to display  EKG   Radiology No results found.  Procedures Procedures (including critical care time)  Medications Ordered in UC Medications - No data to display  Initial Impression / Assessment and Plan / UC Course  I have reviewed the triage vital signs and the nursing notes.  Pertinent labs & imaging results that were available during my care of the patient were reviewed by me and considered in my medical decision making (see chart for details).     Non toxic. Benign physical exam.  Afebrile. Cough at night only, associated with throat symptoms. Suspect post nasal drip as source of symptoms. Zyrtec, three days of prednisone for laryngitis as well. Continue with over the counter treatments, fluids. Return precautions provided. Patient verbalized understanding and agreeable to plan.   Final Clinical Impressions(s) / UC Diagnoses   Final diagnoses:  Pharyngitis, unspecified etiology  Laryngitis     Discharge Instructions     Your exam is overall reassuring today.  Push fluids to ensure adequate hydration and keep secretions thin.  Tylenol and/or ibuprofen as needed for pain or fevers.  The over the counter medications you have been taking to help with symptoms.  Daily zyrtec to help with post nasal drip.  3 days of prednisone to try to help with symptoms as well, including cough and swelling.  Limit speaking to allow vocal rest.  If symptoms worsen or do not improve in the next week to return to be seen or to follow up with your PCP.       ED Prescriptions    Medication Sig Dispense Auth. Provider   predniSONE (DELTASONE) 20 MG tablet Take 1 tablet (20 mg total) by mouth daily with breakfast for 3 days. 3 tablet 10/29/2019 B, NP   cetirizine (ZYRTEC) 10 MG tablet Take 1 tablet (10 mg total) by mouth daily. 30 tablet Linus Mako, NP     PDMP not reviewed this encounter.   Georgetta Haber, NP 11/07/19 [phone]

## 2019-11-07 NOTE — Discharge Instructions (Addendum)
Your exam is overall reassuring today.  Push fluids to ensure adequate hydration and keep secretions thin.  Tylenol and/or ibuprofen as needed for pain or fevers.  The over the counter medications you have been taking to help with symptoms.  Daily zyrtec to help with post nasal drip.  3 days of prednisone to try to help with symptoms as well, including cough and swelling.  Limit speaking to allow vocal rest.  If symptoms worsen or do not improve in the next week to return to be seen or to follow up with your PCP.

## 2020-09-15 ENCOUNTER — Other Ambulatory Visit: Payer: Managed Care, Other (non HMO)

## 2020-09-15 DIAGNOSIS — Z20822 Contact with and (suspected) exposure to covid-19: Secondary | ICD-10-CM

## 2020-09-17 LAB — SARS-COV-2, NAA 2 DAY TAT

## 2020-09-17 LAB — NOVEL CORONAVIRUS, NAA: SARS-CoV-2, NAA: NOT DETECTED

## 2022-08-04 ENCOUNTER — Ambulatory Visit (HOSPITAL_COMMUNITY)
Admission: EM | Admit: 2022-08-04 | Discharge: 2022-08-04 | Disposition: A | Discharge status: Home / Self Care | Payer: Managed Care, Other (non HMO) | Attending: Family Medicine | Admitting: Family Medicine

## 2022-08-04 ENCOUNTER — Encounter (HOSPITAL_COMMUNITY): Payer: Self-pay | Admitting: Emergency Medicine

## 2022-08-04 ENCOUNTER — Ambulatory Visit (HOSPITAL_COMMUNITY): Payer: Managed Care, Other (non HMO)

## 2022-08-04 DIAGNOSIS — R109 Unspecified abdominal pain: Secondary | ICD-10-CM

## 2022-08-04 DIAGNOSIS — R1084 Generalized abdominal pain: Secondary | ICD-10-CM

## 2022-08-04 DIAGNOSIS — R110 Nausea: Secondary | ICD-10-CM

## 2022-08-04 DIAGNOSIS — K5900 Constipation, unspecified: Secondary | ICD-10-CM

## 2022-08-04 LAB — POC URINE PREG, ED: Preg Test, Ur: NEGATIVE

## 2022-08-04 MED ORDER — LACTULOSE 10 GM/15ML PO SOLN: 10.0000 g | Freq: Three times a day (TID) | ORAL | 0 refills | Status: AC

## 2022-08-04 NOTE — ED Provider Notes (Signed)
[facility]    CSN: [id number] Arrival date & time: 08/04/22  8242      History   Chief Complaint Chief Complaint  Patient presents with   Abdominal Pain    Entered by patient    HPI Lynn Mitchell is a 41 y.o. female.    Abdominal Pain  Here for generalized abdominal pain in the last 5 to 7 days.  It is worse on the right and toward the right flank.  She has a little bit of right upper back pain.  No nausea or vomiting but her appetite is decreased.  She has been having some constipation and hard stool, so she did try some Dulcolax and magnesia and that did relieve the pain some the next day.  No fever or chills and no blood in the stool.  No dysuria or hematuria.  Last menstrual cycle was November 12.  Does have a history of IBS and takes MiraLAX daily.  She also takes losartan and citalopram  Past Medical History:  Diagnosis Date   IBS (irritable bowel syndrome)     Patient Active Problem List   Diagnosis Date Noted   Obesity 01/31/2013   Irritable bowel syndrome with constipation 01/31/2013   Preventive measure 01/31/2013    Past Surgical History:  Procedure Laterality Date   CESAREAN SECTION  2000    OB History   No obstetric history on file.      Home Medications    Prior to Admission medications   Medication Sig Start Date End Date Taking? Authorizing Provider  lactulose (CHRONULAC) 10 GM/15ML solution Take 15 mLs (10 g total) by mouth 3 (three) times daily. 08/04/22  Yes Zenia Resides, MD  losartan (COZAAR) 100 MG tablet Take 100 mg by mouth daily. 05/20/22 11/16/22 Yes [provider]  citalopram (CELEXA) 10 MG tablet Take 10 mg by mouth daily. 10/21/19   [provider]    Family History Family History  Problem Relation Age of Onset   Diabetes Mother    Diabetes Maternal Grandmother    Hypertension Maternal Grandmother     Social History Social History   Tobacco Use   Smoking status: Never   Smokeless  tobacco: Never  Vaping Use   Vaping Use: Never used  Substance Use Topics   Alcohol use: Yes    Comment: social   Drug use: No     Allergies   Penicillins   Review of Systems Review of Systems  Gastrointestinal:  Positive for abdominal pain.     Physical Exam Triage Vital Signs ED Triage Vitals  Enc Vitals Group     BP 08/04/22 0931 136/86     Pulse Rate 08/04/22 0931 78     Resp 08/04/22 0931 18     Temp 08/04/22 0931 98.3 F (36.8 C)     Temp Source 08/04/22 0931 Oral     SpO2 08/04/22 0931 97 %     Weight --      Height --      Head Circumference --      Peak Flow --      Pain Score 08/04/22 0929 5     Pain Loc --      Pain Edu? --      Excl. in GC? --    No data found.  Updated Vital Signs BP 136/86 (BP Location: Left Arm)   Pulse 78   Temp 98.3 F (36.8 C) (Oral)   Resp 18  LMP 07/23/2022   SpO2 97%   Visual Acuity Right Eye Distance:   Left Eye Distance:   Bilateral Distance:    Right Eye Near:   Left Eye Near:    Bilateral Near:     Physical Exam Vitals reviewed.  Constitutional:      General: She is not in acute distress.    Appearance: She is not ill-appearing, toxic-appearing or diaphoretic.  Cardiovascular:     Rate and Rhythm: Normal rate and regular rhythm.     Heart sounds: No murmur heard. Pulmonary:     Effort: Pulmonary effort is normal.     Breath sounds: Normal breath sounds.  Abdominal:     Comments: Normoactive bowel sounds.  There is generalized tenderness which is worse on the right.  There is no distention or guarding  Skin:    Coloration: Skin is not jaundiced or pale.  Neurological:     General: No focal deficit present.     Mental Status: She is alert and oriented to person, place, and time.      UC Treatments / Results  Labs (all labs ordered are listed, but only abnormal results are displayed) Labs Reviewed  POC URINE PREG, ED    EKG   Radiology DG Abd 1 View  Result Date: 08/04/2022 CLINICAL  DATA:  Generalized abdominal pain and constipation. Nausea without vomiting. EXAM: ABDOMEN - 1 VIEW COMPARISON:  Abdominal radiograph 10/19/2010 FINDINGS: A moderate to large amount of stool is present in the colon and rectum. No dilated loops of bowel are seen to suggest obstruction. No abnormal abdominal or pelvic calcification is identified. There is mild lumbar levoscoliosis. IMPRESSION: Moderate to large colonic stool burden. No evidence of bowel obstruction. Electronically Signed   By: Sebastian Ache M.D.   On: 08/04/2022 10:31    Procedures Procedures (including critical care time)  Medications Ordered in UC Medications - No data to display  Initial Impression / Assessment and Plan / UC Course  I have reviewed the triage vital signs and the nursing notes.  Pertinent labs & imaging results that were available during my care of the patient were reviewed by me and considered in my medical decision making (see chart for details).        UPT was negative KUB showed a good bit of stool burden.  Lactulose is prescribed.  We discussed possibly going to emergency room for evaluation for possible gallbladder problem, but she wanted to try treating her constipation first.  She will go to the ER if her pain or worsens or if it does not improve or if she begins vomiting Final Clinical Impressions(s) / UC Diagnoses   Final diagnoses:  Generalized abdominal pain  Constipation, unspecified constipation type     Discharge Instructions      Pregnancy test was negative  X-ray of the abdomen did show a lot of stool.  Take lactulose--15 mL or 1 tablespoon 3 times daily by mouth for the constipation take this until the constipation is relieved.  You also may want to do a glycerin or Dulcolax suppository in your rectum to help the constipation faster.  You do not improve or if you are right-sided pain is worsening or you begin throwing up, please present to the emergency room for further  evaluation     ED Prescriptions     Medication Sig Dispense Auth. Provider   lactulose (CHRONULAC) 10 GM/15ML solution Take 15 mLs (10 g total) by mouth 3 (three) times daily. 236  mL Zenia Resides, MD      PDMP not reviewed this encounter.   Zenia Resides, MD 08/04/22 1046

## 2022-08-04 NOTE — Discharge Instructions (Addendum)
Pregnancy test was negative  X-ray of the abdomen did show a lot of stool.  Take lactulose--15 mL or 1 tablespoon 3 times daily by mouth for the constipation take this until the constipation is relieved.  You also may want to do a glycerin or Dulcolax suppository in your rectum to help the constipation faster.  You do not improve or if you are right-sided pain is worsening or you begin throwing up, please present to the emergency room for further evaluation

## 2022-08-04 NOTE — ED Triage Notes (Signed)
Pt c/o intermittent abd pains all week. Taking laxatives to help with BMs which helped some. Reports is generalized but more painful on right side. Reports nauseated but denies vomiting. Denies urinary problems.

## 2022-08-13 ENCOUNTER — Other Ambulatory Visit: Payer: Self-pay

## 2022-08-13 ENCOUNTER — Emergency Department
Admission: EM | Admit: 2022-08-13 | Discharge: 2022-08-13 | Disposition: A | Discharge status: Home / Self Care | Payer: Managed Care, Other (non HMO) | Attending: Emergency Medicine | Admitting: Emergency Medicine

## 2022-08-13 DIAGNOSIS — H5711 Ocular pain, right eye: Secondary | ICD-10-CM | POA: Diagnosis present

## 2022-08-13 MED ORDER — OFLOXACIN 0.3 % OP SOLN: 2.0000 [drp] | Freq: Four times a day (QID) | OPHTHALMIC | 0 refills | Status: AC

## 2022-08-13 MED ORDER — FLUORESCEIN SODIUM 1 MG OP STRP
1.0000 | ORAL_STRIP | Freq: Once | OPHTHALMIC | Status: DC
  Filled 2022-08-13: qty 1

## 2022-08-13 MED ORDER — TETRACAINE HCL 0.5 % OP SOLN
1.0000 [drp] | Freq: Once | OPHTHALMIC | Status: DC
  Filled 2022-08-13: qty 4

## 2022-08-13 NOTE — ED Provider Notes (Signed)
[facility] Provider Note    Event Date/Time   First MD Initiated Contact with Patient 08/13/22 1405     (approximate)   History   Eye Pain   HPI  Lynn Mitchell is a 41 y.o. female   Past medical history of no significant past medical history who presents to the emergency department with right eye pain since being [place] the [place] park on [place].  Pain has been worsening and noticed eye redness to the right eye over the last several days.  She denies any visual changes.  Has no other significant past medical history.  She does wear contacts but took them out after the splash and has irrigated her eye copiously throughout the last several days.   History was obtained via patient.      Physical Exam   Triage Vital Signs: ED Triage Vitals  Enc Vitals Group     BP 08/13/22 1248 131/81     Pulse Rate 08/13/22 1248 69     Resp 08/13/22 1248 18     Temp 08/13/22 1248 98.7 F (37.1 C)     Temp Source 08/13/22 1248 Oral     SpO2 08/13/22 1248 97 %     Weight 08/13/22 1227 197 lb (89.4 kg)     Height 08/13/22 1227 5\' 3"  (1.6 m)     Head Circumference --      Peak Flow --      Pain Score 08/13/22 1227 7     Pain Loc --      Pain Edu? --      Excl. in [place]? --     Most recent vital signs: Vitals:   08/13/22 1248  BP: 131/81  Pulse: 69  Resp: 18  Temp: 98.7 F (37.1 C)  SpO2: 97%    General: Awake, no distress.  CV:  Good peripheral perfusion.  Resp:  Normal effort.  Abd:  No distention.  Other:  Conjunctival injection throughout the right eye.  Intraocular pressure is 15.  Visual acuity is intact.  Extraocular movements intact.  No proptosis.  Exam under fluorescein showed no obvious corneal abrasions or ulcerations and eyelid inversion showed no foreign bodies there.  Her pain resolved with tetracaine.   ED Results / Procedures / Treatments   Labs (all labs ordered are listed, but only abnormal results are  displayed) Labs Reviewed - No data to display  PROCEDURES:  Critical Care performed: No  Procedures   MEDICATIONS ORDERED IN ED: Medications  tetracaine (PONTOCAINE) 0.5 % ophthalmic solution 1 drop (has no administration in time range)  fluorescein ophthalmic strip 1 strip (has no administration in time range)   IMPRESSION / MDM / ASSESSMENT AND PLAN / ED COURSE  I reviewed the triage vital signs and the nursing notes.                              Differential diagnosis includes, but is not limited to, chemical irritation, keratitis foreign body, abrasion or induration,   MDM: Patient with benign exam as above with injected eye after chemical exposure most likely without signs of foreign body or abrasion/ulceration.  Visual acuity is intact and intraocular pressure normal.  I will give fluoroquinolone eyedrops for potential infection contact lens wear and have her continue to irrigate at home and follow-up with [facility] first thing in the morning tomorrow.   Patient's presentation is  most consistent with acute presentation with potential threat to life or bodily function.       FINAL CLINICAL IMPRESSION(S) / ED DIAGNOSES   Final diagnoses:  Acute right eye pain     Rx / DC Orders   ED Discharge Orders          Ordered    ofloxacin (OCUFLOX) 0.3 % ophthalmic solution  4 times daily        08/13/22 1450             Note:  This document was prepared using Dragon voice recognition software and may include unintentional dictation errors.    Pilar Jarvis, MD 08/13/22 [phone]

## 2022-08-13 NOTE — ED Notes (Signed)
Pt verbalizes understanding of d/c instructions, medications and follow up 

## 2022-08-13 NOTE — Discharge Instructions (Signed)
Take acetaminophen 650 mg and ibuprofen 400 mg every 6 hours for pain.  Take with food. Use eye drops as prescribed.   Call [facility] for appointment first thing tomorrow morning.  Thank you for choosing [place] for your health care today!  Please see your primary doctor this week for a follow up appointment.   If you do not have a primary doctor call the following clinics to establish care:  If you have insurance:  [facility] [phone] [address] [place] [place] [address]   [facility]  [phone] [address] [place] [place] [address]   If you do not have insurance:  [facility]  [phone] [address] [place] [address]  Sometimes, in the early stages of certain disease courses it is difficult to detect in the emergency department evaluation -- so, it is important that you continue to monitor your symptoms and call your doctor right away or return to the emergency department if you develop any new or worsening symptoms.  It was my pleasure to care for you today.   Daneil Dan Modesto Charon, MD

## 2022-08-13 NOTE — ED Triage Notes (Signed)
Pt reports went to [facility] lodge on Friday and something scratched her right eye. Pt reports now with eye redness and a scratchy feeling

## 2022-08-13 NOTE — ED Provider Triage Note (Signed)
Emergency Medicine Provider Triage Evaluation Note  Lynn Mitchell , a 41 y.o. female  was evaluated in triage.  Pt complains of irritation to the right eye for the past 2 days. She felt that something was in her eye on Friday and sensation has continued. Eye red this morning. Normal vision.  Physical Exam  BP 131/81 (BP Location: Left Arm)   Pulse 69   Temp 98.7 F (37.1 C) (Oral)   Resp 18   Ht 5\' 3"  (1.6 m)   Wt 89.4 kg   LMP 07/23/2022   SpO2 97%   BMI 34.90 kg/m  Gen:   Awake, no distress   Resp:  Normal effort  MSK:   Moves extremities without difficulty  Other:    Medical Decision Making  Medically screening exam initiated at 12:49 PM.  Appropriate orders placed.  SAHIBA GRANHOLM was informed that the remainder of the evaluation will be completed by another provider, this initial triage assessment does not replace that evaluation, and the importance of remaining in the ED until their evaluation is complete.    Lurline Hare, FNP 08/13/22 1250

## 2022-09-12 ENCOUNTER — Ambulatory Visit
Admission: EM | Admit: 2022-09-12 | Discharge: 2022-09-12 | Disposition: A | Discharge status: Home / Self Care | Payer: Managed Care, Other (non HMO) | Attending: Family Medicine | Admitting: Family Medicine

## 2022-09-12 ENCOUNTER — Encounter: Payer: Self-pay | Admitting: Emergency Medicine

## 2022-09-12 DIAGNOSIS — J069 Acute upper respiratory infection, unspecified: Secondary | ICD-10-CM | POA: Insufficient documentation

## 2022-09-12 LAB — GROUP A STREP BY PCR: Group A Strep by PCR: NOT DETECTED

## 2022-09-12 MED ORDER — PROMETHAZINE-DM 6.25-15 MG/5ML PO SYRP: 5.0000 mL | ORAL_SOLUTION | Freq: Four times a day (QID) | ORAL | 0 refills | Status: DC | PRN

## 2022-09-12 NOTE — Discharge Instructions (Addendum)
Your strep test is negative.  Start on the pharmacy to pick up your prescriptions. You can take Tylenol and/or Ibuprofen as needed for fever reduction and pain relief.    For cough: honey 1/2 to 1 teaspoon (you can dilute the honey in water or another fluid).  You can also use guaifenesin and dextromethorphan for cough. You can use a humidifier for chest congestion and cough.  If you don't have a humidifier, you can sit in the bathroom with the hot shower running.      For sore throat: try warm salt water gargles, Mucinex sore throat cough drops or cepacol lozenges, throat spray, warm tea or water with lemon/honey, popsicles or ice, or OTC cold relief medicine for throat discomfort. You can also purchase chloraseptic spray at the pharmacy or dollar store.   For congestion: take a daily anti-histamine like Zyrtec, Claritin, and a oral decongestant, such as pseudoephedrine.  You can also use Flonase 1-2 sprays in each nostril daily. Afrin is also a good option, if you do not have high blood pressure.    It is important to stay hydrated: drink plenty of fluids (water, gatorade/powerade/pedialyte, juices, or teas) to keep your throat moisturized and help further relieve irritation/discomfort.    Return or go to the Emergency Department if symptoms worsen or do not improve in the next few days

## 2022-09-12 NOTE — ED Provider Notes (Signed)
[facility]    CSN: [id number] Arrival date & time: 09/12/22  0813      History   Chief Complaint Chief Complaint  Patient presents with   Cough   Sore Throat   Hoarse    HPI Lynn Mitchell is a 42 y.o. female.   HPI   Lynn Mitchell presents for respiratory symptoms for the past 3 days. Has cough, sore throat and hoarse and that is not getting better.  Took Mucinex Cold-Flu-sore throat and Robitussin with short term relief.  Symptoms are worse first thing in the morning.  No known sick contacts. Took a COVID test yesterday and it was negative.     Fever : no  Chills: no Sore throat:yes Cough: yes Sputum: yes  Nasal congestion : yes Rhinorrhea: no Myalgias: no Appetite: decreased  Hydration: normal  Abdominal pain: no Nausea: no Vomiting: no Diarrhea: No Rash: no new  Sleep disturbance: yes Headache: no      Past Medical History:  Diagnosis Date   IBS (irritable bowel syndrome)     Patient Active Problem List   Diagnosis Date Noted   Obesity 01/31/2013   Irritable bowel syndrome with constipation 01/31/2013   Preventive measure 01/31/2013    Past Surgical History:  Procedure Laterality Date   CESAREAN SECTION  2000    OB History   No obstetric history on file.      Home Medications    Prior to Admission medications   Medication Sig Start Date End Date Taking? Authorizing Provider  promethazine-dextromethorphan (PROMETHAZINE-DM) 6.25-15 MG/5ML syrup Take 5 mLs by mouth 4 (four) times daily as needed. 09/12/22  Yes Lee-Anne Flicker, DO  citalopram (CELEXA) 10 MG tablet Take 10 mg by mouth daily. 10/21/19   [provider]  lactulose (CHRONULAC) 10 GM/15ML solution Take 15 mLs (10 g total) by mouth 3 (three) times daily. 08/04/22   Zenia Resides, MD  losartan (COZAAR) 100 MG tablet Take 100 mg by mouth daily. 05/20/22 11/16/22  [provider]    Family History Family History  Problem Relation Age of Onset   Diabetes  Mother    Diabetes Maternal Grandmother    Hypertension Maternal Grandmother     Social History Social History   Tobacco Use   Smoking status: Never   Smokeless tobacco: Never  Vaping Use   Vaping Use: Never used  Substance Use Topics   Alcohol use: Yes    Comment: social   Drug use: No     Allergies   Penicillins   Review of Systems Review of Systems: negative unless otherwise stated in HPI.      Physical Exam Triage Vital Signs ED Triage Vitals  Enc Vitals Group     BP 09/12/22 0836 128/80     Pulse Rate 09/12/22 0836 84     Resp 09/12/22 0836 16     Temp 09/12/22 0836 98.7 F (37.1 C)     Temp Source 09/12/22 0836 Oral     SpO2 09/12/22 0836 99 %     Weight --      Height --      Head Circumference --      Peak Flow --      Pain Score 09/12/22 0835 3     Pain Loc --      Pain Edu? --      Excl. in [place]? --    No data found.  Updated Vital Signs BP 128/80 (BP Location: Left Arm)  Pulse 84   Temp 98.7 F (37.1 C) (Oral)   Resp 16   LMP 08/19/2022   SpO2 99%   Visual Acuity Right Eye Distance:   Left Eye Distance:   Bilateral Distance:    Right Eye Near:   Left Eye Near:    Bilateral Near:     Physical Exam GEN:     alert, non-toxic appearing female in no distress    HENT:  mucus membranes moist, oropharyngeal without lesions or exudate, 1+ tonsillar hypertrophy, moderate oropharyngeal erythema, moderate erythematous edematous turbinates, clear nasal discharge, bilateral TM normal,  EYES:   pupils equal and reactive, no scleral injection or discharge NECK:  normal ROM, no meningismus   RESP:  no increased work of breathing, clear to auscultation bilaterally CVS:   regular rate and rhythm Skin:   warm and dry    UC Treatments / Results  Labs (all labs ordered are listed, but only abnormal results are displayed) Labs Reviewed  GROUP A STREP BY PCR    EKG   Radiology No results found.  Procedures Procedures (including critical  care time)  Medications Ordered in UC Medications - No data to display  Initial Impression / Assessment and Plan / UC Course  I have reviewed the triage vital signs and the nursing notes.  Pertinent labs & imaging results that were available during my care of the patient were reviewed by me and considered in my medical decision making (see chart for details).       Pt is a 42 y.o. female who presents for 3-4 days of respiratory symptoms. [name] is afebrile here without recent antipyretics. Satting well on room air. Overall pt is non-toxic appearing, well hydrated, without respiratory distress. Pulmonary exam is unremarkable.  Home COVID was negative. Discussed utility of influenza testing without fever, symptoms fo 4 days and outside the treatment window.  She is agreeable to defer testing.  Strep PCR obtained as she has erythema and tonsillar hypertrophy.  Strep PCR was negative.  History most consistent with viral respiratory illness. Discussed symptomatic treatment.  Promethazine-DM sent to pharmacy. Explained lack of efficacy of antibiotics in viral disease.  Typical duration of symptoms discussed. Work note provided, per pt request.   Return and ED precautions given and voiced understanding. Discussed MDM, treatment plan and plan for follow-up with patient who agrees with plan.     Final Clinical Impressions(s) / UC Diagnoses   Final diagnoses:  Viral URI with cough     Discharge Instructions      Your strep test is negative.  Start on the pharmacy to pick up your prescriptions. You can take Tylenol and/or Ibuprofen as needed for fever reduction and pain relief.    For cough: honey 1/2 to 1 teaspoon (you can dilute the honey in water or another fluid).  You can also use guaifenesin and dextromethorphan for cough. You can use a humidifier for chest congestion and cough.  If you don't have a humidifier, you can sit in the bathroom with the hot shower running.      For sore throat:  try warm salt water gargles, Mucinex sore throat cough drops or cepacol lozenges, throat spray, warm tea or water with lemon/honey, popsicles or ice, or OTC cold relief medicine for throat discomfort. You can also purchase chloraseptic spray at the pharmacy or dollar store.   For congestion: take a daily anti-histamine like Zyrtec, Claritin, and a oral decongestant, such as pseudoephedrine.  You can also use Flonase  1-2 sprays in each nostril daily. Afrin is also a good option, if you do not have high blood pressure.    It is important to stay hydrated: drink plenty of fluids (water, gatorade/powerade/pedialyte, juices, or teas) to keep your throat moisturized and help further relieve irritation/discomfort.    Return or go to the Emergency Department if symptoms worsen or do not improve in the next few days      ED Prescriptions     Medication Sig Dispense Auth. Provider   promethazine-dextromethorphan (PROMETHAZINE-DM) 6.25-15 MG/5ML syrup Take 5 mLs by mouth 4 (four) times daily as needed. 118 mL Lyndee Hensen, DO      PDMP not reviewed this encounter.   Lyndee Hensen, DO 09/12/22 1118

## 2022-09-12 NOTE — ED Triage Notes (Signed)
Pt presents with a cough, sore throat and hoarse x 3 days.

## 2022-12-20 ENCOUNTER — Ambulatory Visit
Admission: EM | Admit: 2022-12-20 | Discharge: 2022-12-20 | Disposition: A | Discharge status: Home / Self Care | Payer: Managed Care, Other (non HMO)

## 2022-12-20 DIAGNOSIS — J069 Acute upper respiratory infection, unspecified: Secondary | ICD-10-CM

## 2022-12-20 MED ORDER — BENZONATATE 100 MG PO CAPS: ORAL_CAPSULE | ORAL | 0 refills | Status: DC

## 2022-12-20 MED ORDER — HYDROCOD POLI-CHLORPHE POLI ER 10-8 MG/5ML PO SUER: 5.0000 mL | Freq: Two times a day (BID) | ORAL | 0 refills | Status: AC | PRN

## 2022-12-20 NOTE — ED Triage Notes (Signed)
Pt c/o cough & muscle pain x3 days. Denies any fevers or chills. Otc mucinex & alkaseltzer w/o relief.

## 2022-12-20 NOTE — ED Provider Notes (Addendum)
[facility]    CSN: [id number] Arrival date & time: 12/20/22  0908      History   Chief Complaint Chief Complaint  Patient presents with   Cough   Muscle Pain    HPI Lynn Mitchell is a 42 y.o. female.    Cough Muscle Pain    Presents to urgent care with complaint of cough and "muscle pain" x 3 days.  Denies any any fever, chills, myalgias.  Has been using OTC Mucinex and Alka-Seltzer without relief.  She is not certain if either of these medications contain a cough suppressant though she says they "Alka-Seltzer" is a combination medication used for allergies and colds.  She does endorse using Robitussin DM at nighttime but continues to be awake during the night coughing.  Endorses 1 episode of vomiting after a coughing fit.  She says the muscle pain is in her upper left shoulder and is worsened when she coughs or breathes deeply.  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      Home Medications    Prior to Admission medications   Medication Sig Start Date End Date Taking? Authorizing Provider  lactulose (CHRONULAC) 10 GM/15ML solution Take 15 mLs (10 g total) by mouth 3 (three) times daily. 08/04/22  Yes Zenia Resides, MD  losartan (COZAAR) 100 MG tablet Take 100 mg by mouth daily. 05/20/22 12/20/22 Yes [provider]  Semaglutide-Weight Management 0.5 MG/0.5ML SOAJ Inject into the skin. 12/06/22 01/05/23 Yes [provider]  citalopram (CELEXA) 10 MG tablet Take 10 mg by mouth daily. 10/21/19   [provider]  promethazine-dextromethorphan (PROMETHAZINE-DM) 6.25-15 MG/5ML syrup Take 5 mLs by mouth 4  (four) times daily as needed. 09/12/22   Katha Cabal, DO    Family History Family History  Problem Relation Age of Onset   Diabetes Mother    Diabetes Maternal Grandmother    Hypertension Maternal Grandmother     Social History Social History   Tobacco Use   Smoking status: Never   Smokeless tobacco: Never  Vaping Use   Vaping Use: Never used  Substance Use Topics   Alcohol use: Yes    Comment: social   Drug use: No     Allergies   Penicillins   Review of Systems Review of Systems  Respiratory:  Positive for cough.      Physical Exam Triage Vital Signs ED Triage Vitals  Enc Vitals Group     BP 12/20/22 0925 112/78     Pulse Rate 12/20/22 0925 78     Resp 12/20/22 0925 16     Temp 12/20/22 0925 98.7 F (37.1 C)     Temp Source 12/20/22 0925 Oral     SpO2 12/20/22 0925 97 %     Weight 12/20/22 0924 190 lb (86.2 kg)     Height 12/20/22 0924 5\' 2"  (1.575 m)     Head Circumference --      Peak Flow --      Pain Score 12/20/22 0924 6     Pain Loc --      Pain Edu? --  Excl. in GC? --    No data found.  Updated Vital Signs BP 112/78 (BP Location: Left Arm)   Pulse 78   Temp 98.7 F (37.1 C) (Oral)   Resp 16   Ht 5\' 2"  (1.575 m)   Wt 190 lb (86.2 kg)   SpO2 97%   BMI 34.75 kg/m   Visual Acuity Right Eye Distance:   Left Eye Distance:   Bilateral Distance:    Right Eye Near:   Left Eye Near:    Bilateral Near:     Physical Exam Vitals reviewed.  Constitutional:      Appearance: Normal appearance.  HENT:     Nose: Congestion present.     Mouth/Throat:     Mouth: Mucous membranes are moist.     Pharynx: Posterior oropharyngeal erythema present. No oropharyngeal exudate.  Cardiovascular:     Rate and Rhythm: Normal rate and regular rhythm.     Pulses: Normal pulses.     Heart sounds: Normal heart sounds.  Pulmonary:     Effort: Pulmonary effort is normal.     Breath sounds: Normal breath sounds.  Skin:    General: Skin is warm  and dry.  Neurological:     General: No focal deficit present.     Mental Status: She is alert.  Psychiatric:        Mood and Affect: Mood normal.        Behavior: Behavior normal.      UC Treatments / Results  Labs (all labs ordered are listed, but only abnormal results are displayed) Labs Reviewed - No data to display  EKG   Radiology No results found.  Procedures Procedures (including critical care time)  Medications Ordered in UC Medications - No data to display  Initial Impression / Assessment and Plan / UC Course  I have reviewed the triage vital signs and the nursing notes.  Pertinent labs & imaging results that were available during my care of the patient were reviewed by me and considered in my medical decision making (see chart for details).   Patient is afebrile here without recent antipyretics. Satting well on room air. Overall is well appearing, well hydrated, without respiratory distress. Pulmonary exam is remarkable only for frequent dry sounding cough throughout the exam.  Lungs CTAB without wheezing, rhonchi, rales.  There is nasal congestion.  Mild pharyngeal erythema is present without peritonsillar exudates.  Patient's symptoms are consistent with an acute viral process.  Low suspicion for influenza or COVID and given symptoms are greater than 3 days, no testing or antiviral therapy for influenza is offered.  Suspect muscular pain is related to her violent coughing.  Supportive care with use of OTC medication for symptom control is recommended.  Additionally, given the presumption the patient is using a cough suppressant and her OTC medication that has been ineffective, will prescribe benzonatate for daytime use and Tussionex for nighttime use.  She endorses past use of narcotic containing medication and has tolerated without issue.  Counseled patient on potential for adverse effects with medications prescribed/recommended today, ER and return-to-clinic  precautions discussed, patient verbalized understanding and agreement with care plan.   Final Clinical Impressions(s) / UC Diagnoses   Final diagnoses:  None   Discharge Instructions   None    ED Prescriptions   None    PDMP not reviewed this encounter.   Charma Igo, FNP 12/20/22 0944    Charma Igo, FNP 12/20/22 [phone]

## 2022-12-20 NOTE — Discharge Instructions (Signed)
Follow up here or with your primary care provider if your symptoms are worsening or not improving.     

## 2023-01-31 ENCOUNTER — Ambulatory Visit
Admission: EM | Admit: 2023-01-31 | Discharge: 2023-01-31 | Disposition: A | Discharge status: Home / Self Care | Payer: Managed Care, Other (non HMO)

## 2023-01-31 DIAGNOSIS — J029 Acute pharyngitis, unspecified: Secondary | ICD-10-CM | POA: Diagnosis not present

## 2023-01-31 DIAGNOSIS — J069 Acute upper respiratory infection, unspecified: Secondary | ICD-10-CM | POA: Diagnosis not present

## 2023-01-31 LAB — POCT RAPID STREP A (OFFICE): Rapid Strep A Screen: NEGATIVE

## 2023-01-31 NOTE — Discharge Instructions (Addendum)
The strep test is negative.    Take Tylenol as needed for fever or discomfort.    Follow-up with your primary care provider if your symptoms are not improving.     

## 2023-01-31 NOTE — ED Provider Notes (Signed)
Lynn Mitchell    CSN: [id number] Arrival date & time: 01/31/23  1134      History   Chief Complaint Chief Complaint  Patient presents with   Sore Throat    HPI Lynn Mitchell is a 42 y.o. female.  Patient presents with sore throat, mild cough, fatigue, chills x 1 day.  No OTC medications taken today.  No fever, rash, shortness of breath symptoms.  Her medical history includes hypertension, IBS and obesity.  The history is provided by the patient and medical records.    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      Home Medications    Prior to Admission medications   Medication Sig Start Date End Date Taking? Authorizing Provider  WEGOVY 1 MG/0.5ML SOAJ Inject 1 mg into the skin once a week. 01/01/23  Yes [provider]  benzonatate (TESSALON) 100 MG capsule Take 1-2 tablets 3 times a day as needed for cough 12/20/22   Immordino, Stephen, FNP  citalopram (CELEXA) 10 MG tablet Take 10 mg by mouth daily. 10/21/19   [provider]  lactulose (CHRONULAC) 10 GM/15ML solution Take 15 mLs (10 g total) by mouth 3 (three) times daily. 08/04/22   Zenia Resides, MD  losartan (COZAAR) 100 MG tablet Take 100 mg by mouth daily. 05/20/22 12/20/22  [provider]  promethazine-dextromethorphan (PROMETHAZINE-DM) 6.25-15 MG/5ML syrup Take 5 mLs by mouth 4 (four) times daily as needed. 09/12/22   Katha Cabal, DO    Family History Family History  Problem Relation Age of Onset   Diabetes Mother    Diabetes Maternal Grandmother    Hypertension Maternal Grandmother     Social History Social History   Tobacco Use   Smoking  status: Never   Smokeless tobacco: Never  Vaping Use   Vaping Use: Never used  Substance Use Topics   Alcohol use: Yes    Comment: social   Drug use: No     Allergies   Penicillins   Review of Systems Review of Systems  Constitutional:  Positive for chills and fatigue. Negative for fever.  HENT:  Positive for sore throat. Negative for ear pain.   Respiratory:  Negative for cough and shortness of breath.   Cardiovascular:  Negative for chest pain and palpitations.  Gastrointestinal:  Negative for diarrhea and vomiting.  Skin:  Negative for rash.  All other systems reviewed and are negative.    Physical Exam Triage Vital Signs ED Triage Vitals  Enc Vitals Group     BP      Pulse      Resp      Temp      Temp src      SpO2      Weight      Height      Head Circumference      Peak Flow      Pain Score      Pain Loc      Pain Edu?      Excl. in GC?    No data found.  Updated Vital Signs BP 110/72 (BP Location:  Right Arm)   Pulse 88   Temp 98.7 F (37.1 C) (Oral)   Resp 20   LMP 01/20/2023 (Exact Date)   SpO2 98%   Visual Acuity Right Eye Distance:   Left Eye Distance:   Bilateral Distance:    Right Eye Near:   Left Eye Near:    Bilateral Near:     Physical Exam Vitals and nursing note reviewed.  Constitutional:      General: She is not in acute distress.    Appearance: Normal appearance. She is well-developed. She is not ill-appearing.  HENT:     Right Ear: Tympanic membrane normal.     Left Ear: Tympanic membrane normal.     Nose: Nose normal.     Mouth/Throat:     Mouth: Mucous membranes are moist.     Pharynx: Posterior oropharyngeal erythema present.  Cardiovascular:     Rate and Rhythm: Normal rate and regular rhythm.     Heart sounds: Normal heart sounds.  Pulmonary:     Effort: Pulmonary effort is normal. No respiratory distress.     Breath sounds: Normal breath sounds.  Musculoskeletal:     Cervical back: Neck supple.  Skin:     General: Skin is warm and dry.  Neurological:     Mental Status: She is alert.  Psychiatric:        Mood and Affect: Mood normal.        Behavior: Behavior normal.      UC Treatments / Results  Labs (all labs ordered are listed, but only abnormal results are displayed) Labs Reviewed  POCT RAPID STREP A (OFFICE)    EKG   Radiology No results found.  Procedures Procedures (including critical care time)  Medications Ordered in UC Medications - No data to display  Initial Impression / Assessment and Plan / UC Course  I have reviewed the triage vital signs and the nursing notes.  Pertinent labs & imaging results that were available during my care of the patient were reviewed by me and considered in my medical decision making (see chart for details).    Sore throat, Viral URI.  Rapid strep negative.  Discussed symptomatic treatment including Tylenol, rest, hydration.  Instructed patient to follow up with her PCP if symptoms are not improving.  She agrees to plan of care.   Final Clinical Impressions(s) / UC Diagnoses   Final diagnoses:  Sore throat  Viral URI     Discharge Instructions      The strep test is negative.    Take Tylenol as needed for fever or discomfort.     Follow-up with your primary care provider if your symptoms are not improving.         ED Prescriptions   None    PDMP not reviewed this encounter.   Mickie Bail, NP 01/31/23 1201

## 2023-01-31 NOTE — ED Triage Notes (Signed)
Pt c/o starting last night with very sore throat, scratchy throat, fatigue, chills. Denies: F. Pt states she was exposed to family member with strep throat recently.

## 2024-01-11 ENCOUNTER — Encounter: Payer: Self-pay | Admitting: Emergency Medicine

## 2024-01-11 ENCOUNTER — Ambulatory Visit
Admission: EM | Admit: 2024-01-11 | Discharge: 2024-01-11 | Disposition: A | Discharge status: Home / Self Care | Attending: Emergency Medicine | Admitting: Emergency Medicine

## 2024-01-11 DIAGNOSIS — N898 Other specified noninflammatory disorders of vagina: Secondary | ICD-10-CM | POA: Insufficient documentation

## 2024-01-11 NOTE — ED Provider Notes (Signed)
 [facility]    CSN: [id number] Arrival date & time: 01/11/24  1423      History   Chief Complaint Chief Complaint  Patient presents with   Vaginal Itching    HPI Lynn Mitchell is a 43 y.o. female.   HPI  43 year old female with past medical history significant for IBS presents for evaluation of vaginal itching with a white vaginal discharge that she first noticed 3 days ago.  She denies any odor to the discharge and she denies any pain with urination or urinary urgency or frequency.  She is also not concerned about STIs.  She has not been on antibiotics recently.  She does report that she recently took a bubble bath with a new bubble bath solution and that her body is sensitive to chemicals and detergents.  She is concerned that this may have been the cause of her discharge.  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      Home Medications    Prior to Admission medications   Medication Sig Start Date End Date Taking? Authorizing Provider  losartan (COZAAR) 100 MG tablet Take 100 mg by mouth daily. 05/20/22 01/11/24 Yes [provider]  WEGOVY 1 MG/0.5ML SOAJ Inject 1 mg into the skin once a week. 01/01/23  Yes [provider]  lactulose  (CHRONULAC ) 10 GM/15ML solution Take 15 mLs (10 g total) by mouth 3 (three) times daily. 08/04/22   Ann Keto, MD    Family History Family History  Problem Relation Age of Onset   Diabetes Mother    Diabetes Maternal Grandmother    Hypertension Maternal Grandmother     Social History Social History   Tobacco Use   Smoking status: Never   Smokeless tobacco: Never  Vaping  Use   Vaping status: Never Used  Substance Use Topics   Alcohol use: Yes    Comment: social   Drug use: No     Allergies   Penicillins   Review of Systems Review of Systems  Genitourinary:  Positive for vaginal discharge and vaginal pain. Negative for dysuria, frequency, hematuria and urgency.  Musculoskeletal:  Negative for back pain.     Physical Exam Triage Vital Signs ED Triage Vitals  Encounter Vitals Group     BP      Systolic BP Percentile      Diastolic BP Percentile      Pulse      Resp      Temp      Temp src      SpO2      Weight      Height      Head Circumference      Peak Flow      Pain Score      Pain Loc      Pain Education      Exclude from Growth Chart    No data found.  Updated Vital Signs BP 124/75 (BP Location: Left Arm)   Pulse 72   Temp 98.7 F (37.1 C) (Oral)   Resp 16   Ht 5\' 2"  (1.575 m)   Wt  190 lb 0.6 oz (86.2 kg)   LMP 12/21/2023 (Approximate)   SpO2 100%   BMI 34.76 kg/m   Visual Acuity Right Eye Distance:   Left Eye Distance:   Bilateral Distance:    Right Eye Near:   Left Eye Near:    Bilateral Near:     Physical Exam Vitals and nursing note reviewed.  Constitutional:      Appearance: Normal appearance. She is not ill-appearing.  HENT:     Head: Normocephalic and atraumatic.  Cardiovascular:     Rate and Rhythm: Normal rate and regular rhythm.     Pulses: Normal pulses.     Heart sounds: Normal heart sounds. No murmur heard.    No friction rub. No gallop.  Pulmonary:     Effort: Pulmonary effort is normal.     Breath sounds: Normal breath sounds. No wheezing, rhonchi or rales.  Skin:    General: Skin is warm and dry.     Capillary Refill: Capillary refill takes less than 2 seconds.     Findings: No rash.  Neurological:     General: No focal deficit present.     Mental Status: She is alert and oriented to person, place, and time.      UC Treatments / Results  Labs (all labs ordered are listed,  but only abnormal results are displayed) Labs Reviewed  CERVICOVAGINAL ANCILLARY ONLY    EKG   Radiology No results found.  Procedures Procedures (including critical care time)  Medications Ordered in UC Medications - No data to display  Initial Impression / Assessment and Plan / UC Course  I have reviewed the triage vital signs and the nursing notes.  Pertinent labs & imaging results that were available during my care of the patient were reviewed by me and considered in my medical decision making (see chart for details).   Patient is a pleasant, nontoxic-appearing 43 year old female presenting for evaluation of vaginal discharge as outlined HPI above.  She denies any associated urinary symptoms and she denies any concerns for STIs.  She did recently take a bubble bath, which she does not normally do and does report that her body is sensitive to soaps and detergents.  She has not been on any antibiotics recently.  I will order a vaginal cytology swab to evaluate for the presence of BV or yeast.  I have advised the patient that if she test positive for other infection she will be contacted by phone and treatment options will be provided.  If results are negative they will appear in her MyChart and if her results are negative she should follow-up with her OB/GYN if she continues to experience symptoms.   Final Clinical Impressions(s) / UC Diagnoses   Final diagnoses:  Vaginal discharge     Discharge Instructions      Your vaginal swab is pending and will test for BV or yeast.  If your results are positive you will be contacted by phone.  If the results are negative they will just appear your MyChart.  If your vaginal swab is negative for BV or yeast, and you continue to experience symptoms, I would recommend making an appointment with your OB/GYN for further evaluation.     ED Prescriptions   None    PDMP not reviewed this encounter.   Kent Pear, NP 01/11/24 [phone]

## 2024-01-11 NOTE — ED Triage Notes (Signed)
 Pt c/o vaginal itching and white vaginal discharge. Started 3 days ago. Denies urinary symptoms.

## 2024-01-11 NOTE — Discharge Instructions (Addendum)
 Your vaginal swab is pending and will test for BV or yeast.  If your results are positive you will be contacted by phone.  If the results are negative they will just appear your MyChart.  If your vaginal swab is negative for BV or yeast, and you continue to experience symptoms, I would recommend making an appointment with your OB/GYN for further evaluation.

## 2024-01-14 ENCOUNTER — Telehealth (HOSPITAL_COMMUNITY): Payer: Self-pay

## 2024-01-14 LAB — CERVICOVAGINAL ANCILLARY ONLY
Bacterial Vaginitis (gardnerella): POSITIVE — AB
Candida Glabrata: NEGATIVE
Candida Vaginitis: NEGATIVE
Chlamydia: NEGATIVE
Comment: NEGATIVE
Comment: NEGATIVE
Comment: NEGATIVE
Comment: NEGATIVE
Comment: NEGATIVE
Comment: NORMAL
Neisseria Gonorrhea: NEGATIVE
Trichomonas: NEGATIVE

## 2024-01-14 MED ORDER — METRONIDAZOLE 500 MG PO TABS: 500.0000 mg | ORAL_TABLET | Freq: Two times a day (BID) | ORAL | 0 refills | Status: AC

## 2024-01-14 NOTE — Telephone Encounter (Signed)
 Per protocol, pt requires tx with metronidazole. Rx sent to pharmacy on file.
